# Patient Record
Sex: Female | Born: 1990 | Race: White | Hispanic: No | Marital: Married | Smoking: Never smoker
Health system: Southern US, Community
[De-identification: ages and names within clinical notes are randomized; demographics above are authoritative.]

---

## 2020-06-01 ENCOUNTER — Emergency Department
Admission: EM | Admit: 2020-06-01 | Discharge: 2020-06-01 | Disposition: A | Discharge status: Home / Self Care | Payer: BC Managed Care – PPO | Attending: Emergency Medicine | Admitting: Emergency Medicine

## 2020-06-01 ENCOUNTER — Emergency Department: Payer: BC Managed Care – PPO

## 2020-06-01 ENCOUNTER — Other Ambulatory Visit: Payer: Self-pay

## 2020-06-01 DIAGNOSIS — M791 Myalgia, unspecified site: Secondary | ICD-10-CM | POA: Insufficient documentation

## 2020-06-01 DIAGNOSIS — S199XXA Unspecified injury of neck, initial encounter: Secondary | ICD-10-CM | POA: Diagnosis present

## 2020-06-01 DIAGNOSIS — S39012A Strain of muscle, fascia and tendon of lower back, initial encounter: Secondary | ICD-10-CM | POA: Diagnosis not present

## 2020-06-01 DIAGNOSIS — M7918 Myalgia, other site: Secondary | ICD-10-CM

## 2020-06-01 DIAGNOSIS — Y9210 Unspecified residential institution as the place of occurrence of the external cause: Secondary | ICD-10-CM | POA: Diagnosis not present

## 2020-06-01 DIAGNOSIS — S161XXA Strain of muscle, fascia and tendon at neck level, initial encounter: Secondary | ICD-10-CM | POA: Insufficient documentation

## 2020-06-01 DIAGNOSIS — Y9389 Activity, other specified: Secondary | ICD-10-CM | POA: Diagnosis not present

## 2020-06-01 LAB — POC URINE PREG, ED: Preg Test, Ur: NEGATIVE

## 2020-06-01 MED ORDER — NAPROXEN 500 MG PO TABS: 500.0000 mg | ORAL_TABLET | Freq: Two times a day (BID) | ORAL | Status: AC

## 2020-06-01 MED ORDER — NAPROXEN 500 MG PO TABS
500.0000 mg | ORAL_TABLET | Freq: Once | ORAL | Status: AC
  Administered 2020-06-01: 500 mg via ORAL
  Filled 2020-06-01: qty 1

## 2020-06-01 MED ORDER — CYCLOBENZAPRINE HCL 10 MG PO TABS
10.0000 mg | ORAL_TABLET | Freq: Once | ORAL | Status: AC
  Administered 2020-06-01: 10 mg via ORAL
  Filled 2020-06-01: qty 1

## 2020-06-01 MED ORDER — ORPHENADRINE CITRATE ER 100 MG PO TB12: 100.0000 mg | ORAL_TABLET | Freq: Two times a day (BID) | ORAL | 0 refills | Status: AC

## 2020-06-01 MED ORDER — ONDANSETRON 8 MG PO TBDP
8.0000 mg | ORAL_TABLET | Freq: Once | ORAL | Status: AC
  Administered 2020-06-01: 8 mg via ORAL
  Filled 2020-06-01: qty 1

## 2020-06-01 MED ORDER — LIDOCAINE 5 % EX PTCH
2.0000 | MEDICATED_PATCH | CUTANEOUS | Status: DC
  Administered 2020-06-01: 2 via TRANSDERMAL
  Filled 2020-06-01: qty 2

## 2020-06-01 MED ORDER — LIDOCAINE 5 % EX PTCH: 1.0000 | MEDICATED_PATCH | Freq: Two times a day (BID) | CUTANEOUS | 0 refills | Status: AC

## 2020-06-01 NOTE — ED Notes (Signed)
Pt states she is going outside to get her food.
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
## 2020-06-01 NOTE — Discharge Instructions (Signed)
Follow discharge care instruction take medication as directed. °
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
## 2024-08-09 ENCOUNTER — Emergency Department: Payer: Self-pay

## 2024-08-09 ENCOUNTER — Emergency Department
Admission: EM | Admit: 2024-08-09 | Discharge: 2024-08-09 | Disposition: A | Discharge status: Home / Self Care | Payer: Self-pay | Attending: Emergency Medicine | Admitting: Emergency Medicine

## 2024-08-09 DIAGNOSIS — Z5189 Encounter for other specified aftercare: Secondary | ICD-10-CM | POA: Insufficient documentation

## 2024-08-09 DIAGNOSIS — L03031 Cellulitis of right toe: Secondary | ICD-10-CM | POA: Insufficient documentation

## 2024-08-09 MED ORDER — MUPIROCIN 2 % EX OINT: 1.0000 | TOPICAL_OINTMENT | Freq: Two times a day (BID) | CUTANEOUS | 0 refills | Status: AC

## 2024-08-09 MED ORDER — CEPHALEXIN 500 MG PO CAPS: 500.0000 mg | ORAL_CAPSULE | Freq: Four times a day (QID) | ORAL | 0 refills | Status: AC

## 2024-08-09 NOTE — Discharge Instructions (Addendum)
 Take the antibiotics as prescribed, in addition to the doxycycline that you were given by the urgent care.  Please return for recheck in 2 days if your symptoms or not improving.  Please follow-up with podiatry.  Please return for any new, worsening, or changing symptoms or other concerns.  It was a pleasure caring for you today.

## 2024-08-09 NOTE — ED Triage Notes (Signed)
 Pt presents to the ED via POV from home with a wound on her 1st toe of right foot. Pt reports first realizing that it looked bad on Sunday. Pt started taking an antibiotic on Sunday night. Reports worsening in infection. Infection appears to be localized to first toe. Pt denies fevers.

## 2024-08-09 NOTE — ED Provider Notes (Signed)
 "  [facility] Provider Note    None    (approximate)   History   Wound Check   HPI  Nancy Singleton is a 34 y.o. female who presents today for evaluation of toe injury.  Patient reports that she kicked a dinner tray accidentally and sustained an open wound to the lateral aspect of her toe.  She reports that she developed erythema, and went to an urgent care 2 days ago who prescribed her doxycycline.  She reports that she has taken 2 doses of the doxycycline and has not noticed significant change.  She denies history of diabetes.  There are no active problems to display for this patient.         Physical Exam   Triage Vital Signs: ED Triage Vitals  Encounter Vitals Group     BP 08/09/24 1258 (!) 134/102     Girls Systolic BP Percentile --      Girls Diastolic BP Percentile --      Boys Systolic BP Percentile --      Boys Diastolic BP Percentile --      Pulse Rate 08/09/24 1258 86     Resp 08/09/24 1258 17     Temp 08/09/24 1258 98.7 F (37.1 C)     Temp Source 08/09/24 1258 Oral     SpO2 08/09/24 1258 97 %     Weight 08/09/24 1305 182 lb (82.6 kg)     Height 08/09/24 1305 5' 4 (1.626 m)     Head Circumference --      Peak Flow --      Pain Score 08/09/24 1305 4     Pain Loc --      Pain Education --      Exclude from Growth Chart --     Most recent vital signs: Vitals:   08/09/24 1258  BP: (!) 134/102  Pulse: 86  Resp: 17  Temp: 98.7 F (37.1 C)  SpO2: 97%    Physical Exam Vitals and nursing note reviewed.  Constitutional:      General: Awake and alert. No acute distress.    Appearance: Normal appearance. The patient is normal weight.  HENT:     Head: Normocephalic and atraumatic.     Mouth: Mucous membranes are moist.  Eyes:     General: PERRL. Normal EOMs        Right eye: No discharge.        Left eye: No discharge.     Conjunctiva/sclera: Conjunctivae normal.  Cardiovascular:     Rate and Rhythm: Normal rate and  regular rhythm.     Pulses: Normal pulses.  Pulmonary:     Effort: Pulmonary effort is normal. No respiratory distress.     Breath sounds: Normal breath sounds.  Abdominal:     Abdomen is soft. There is no abdominal tenderness. No rebound or guarding. No distention. Musculoskeletal:        General: No swelling. Normal range of motion.     Cervical back: Normal range of motion and neck supple.  Right great toe with erythema to the dorsum of the toe extending to the DIP.  No fluctuance.  No plantar abnormalities.  The lateral aspect of the toe with open area without active drainage.  Tenderness to palpation.  No joint tenderness.  Able to range toe.  Normal pedal pulse.  No lymphangitis.  No crepitus. Skin:    General: Skin is warm and dry.     Capillary  Refill: Capillary refill takes less than 2 seconds.     Findings: No rash.  Neurological:     Mental Status: The patient is awake and alert.      ED Results / Procedures / Treatments   Labs (all labs ordered are listed, but only abnormal results are displayed) Labs Reviewed - No data to display   EKG     RADIOLOGY I independently reviewed and interpreted imaging and agree with radiologists findings.     PROCEDURES:  Critical Care performed:   Procedures   MEDICATIONS ORDERED IN ED: Medications - No data to display   IMPRESSION / MDM / ASSESSMENT AND PLAN / ED COURSE  I reviewed the triage vital signs and the nursing notes.   Differential diagnosis includes, but is not limited to, cellulitis, abscess, paronychia, osteomyelitis.  Patient is awake and alert, hemodynamically stable and afebrile.  She is nontoxic in appearance.  There is erythema isolated to the great toe extending down to the PIP.  She is able to flex and extend toe, do not suspect septic joint.  There is no active drainage.  There is no evidence of paronychia or felon.  There is no lymphangitis.  Patient has been on only 1.5 days of the  doxycycline, though this does not appear to be complete coverage, so I will also add Keflex  in addition to her doxycycline, and add mupirocin  for the open area of her wound.  There is no findings of osteomyelitis on her x-ray.  I recommended outpatient follow-up with podiatry in the next 2 days.  I recommended strict return precautions to the emergency department in 2 days if her symptoms have not improved.  We also discussed other return precautions.  Patient understands and agrees with plan.  She was given a work note per her request.  She was discharged in stable condition.`   Patient's presentation is most consistent with acute complicated illness / injury requiring diagnostic workup.       FINAL CLINICAL IMPRESSION(S) / ED DIAGNOSES   Final diagnoses:  Visit for wound check  Cellulitis of toe of right foot     Rx / DC Orders   ED Discharge Orders          Ordered    cephALEXin  (KEFLEX ) 500 MG capsule  4 times daily        08/09/24 1439    mupirocin  ointment (BACTROBAN ) 2 %  2 times daily        08/09/24 1439             Note:  This document was prepared using Dragon voice recognition software and may include unintentional dictation errors.   Duante Arocho E, PA-C 08/09/24 1450    Bradler, Evan K, MD 08/09/24 1532  "
